# Patient Record
Sex: Male | Born: 2000 | Race: White | Hispanic: No | Marital: Single | Smoking: Never smoker
Health system: Southern US, Community
[De-identification: ages and names within clinical notes are randomized; demographics above are authoritative.]

---
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
## 2020-12-16 ENCOUNTER — Ambulatory Visit: Payer: Self-pay | Admitting: Nurse Practitioner

## 2020-12-16 ENCOUNTER — Other Ambulatory Visit: Payer: Self-pay

## 2020-12-16 VITALS — BP 133/73 | HR 61 | Temp 98.4°F | Resp 17

## 2020-12-16 DIAGNOSIS — S0591XA Unspecified injury of right eye and orbit, initial encounter: Secondary | ICD-10-CM

## 2020-12-16 MED ORDER — POLYMYXIN B-TRIMETHOPRIM 10000-0.1 UNIT/ML-% OP SOLN: 1.0000 [drp] | OPHTHALMIC | 0 refills | Status: AC

## 2020-12-16 NOTE — Progress Notes (Signed)
[name] 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   Injury Description  Patient was mowing the grass this morning and felt something fly into his eye. Unsure if it was insect, grass or something from lawn. He immediately went to wash his eye out with water and then reported to [name] for evaluation.    Provider Note Right eye sclera red and eye is tearing. Difficulty to keep eye open. Eye irrigated three times with saline. Symptoms improved but not resolved. Right eye exam without foreign body identified. EOMI, PERRLA, eyes tearing but vision not impaired. Attempted eye pad/patch without relief, advised patient to start eye drops today, rest at home. Sunglasses outdoors and return to clinic for evaluation tomorrow. If symptoms persist will consider referral to Opthalmology.     Diagnosis  1. Right eye injury, initial encounter  - trimethoprim-polymyxin b (POLYTRIM) ophthalmic solution; Place 1 drop into the right eye every 4 (four) hours for 5 days.  Dispense: 10 mL; Refill: 0    Continue eye drops tonight, rest eye RTC tomorrow.   Meds ordered this encounter  Medications   trimethoprim-polymyxin b (POLYTRIM) ophthalmic solution    Sig: Place 1 drop into the right eye every 4 (four) hours for 5 days.    Dispense:   10 mL    Refill:  0      Referred to consider opthalmology tomorrow if no improvement     Return to Work Status Will return to clinic 12/17/20 for evaluation and determination of work return status. Should remain out of work until that time.    Provider Signature ________________________________________Date_________   Employee Signature _______________________________________Date_________   Please email this completed form to Angus Seller, Director of Risk Management at [email or web address] within 24 hours of visit.

## 2020-12-17 ENCOUNTER — Encounter: Payer: Self-pay | Admitting: Nurse Practitioner

## 2020-12-17 ENCOUNTER — Ambulatory Visit: Payer: Self-pay | Admitting: Nurse Practitioner

## 2020-12-17 VITALS — BP 135/64 | HR 76 | Temp 98.3°F | Resp 16 | Ht 72.0 in | Wt 181.2 lb

## 2020-12-17 DIAGNOSIS — S0591XD Unspecified injury of right eye and orbit, subsequent encounter: Secondary | ICD-10-CM

## 2020-12-17 NOTE — Progress Notes (Signed)
[name] [address]  [place], [place] [address] Phone [phone] Fax  [phone]  Worker's Compensation Report Form   Justin Faulkner Date of HGDJM:[id number] Phone Number: [phone] Email:[email or web address] Department: Ronnell [place] Job Title: Landscaper Supervisor: Christophe Louis Supervisor Notified: Yes  Date of Injury: 12/16/20 Time of Injury: 0800 Shift Worked: 1st Location where injury occurred (address or landmark): [employer] Part Injured: Right eye     Injury Description  Patient was mowing the grass this morning and felt something fly into his eye. Unsure if it was insect, grass or something from lawn. He immediately went to wash his eye out with water and then reported to [name] for evaluation.      Provider Note Eye is improving today. Patient states that it feels dry. Sclera clear. Lids swept no lesions or foreign bodies identified. Conjunctiva WNL. No drainage or erythema no swelling. EOMI, PERRLA.    Diagnosis  Right eye injury   Medications Prescribed  Finish eye drops over the weekend    Referred to N/A    Return to Work Status May return to work now, advised wearing eye protection and sunglasses when outdoors as well. RTC as needed    Provider Signature ________________________________________Date_________   Employee Signature _______________________________________Date_________   Please email this completed form to Angus Seller, Director of Risk Management at [email or web address] within 24 hours of visit.

## 2021-10-04 ENCOUNTER — Ambulatory Visit: Payer: Self-pay | Admitting: Medical

## 2021-10-04 ENCOUNTER — Encounter: Payer: Self-pay | Admitting: Medical

## 2021-10-04 VITALS — BP 102/78 | HR 68 | Temp 97.1°F | Resp 16

## 2021-10-04 DIAGNOSIS — H1031 Unspecified acute conjunctivitis, right eye: Secondary | ICD-10-CM

## 2021-10-04 DIAGNOSIS — T63441A Toxic effect of venom of bees, accidental (unintentional), initial encounter: Secondary | ICD-10-CM

## 2021-10-04 MED ORDER — SULFACETAMIDE SODIUM 10 % OP SOLN: 1.0000 [drp] | Freq: Four times a day (QID) | OPHTHALMIC | 0 refills | Status: AC

## 2021-10-04 MED ORDER — PREDNISONE 10 MG (21) PO TBPK: ORAL_TABLET | ORAL | 0 refills | Status: DC

## 2021-10-04 NOTE — Progress Notes (Signed)
Patient ID: Justin Faulkner, male   DOB: Jan 02, 2001, 21 y.o.   MRN: [id number]  [facility]  [facility] [address]  [place], [place] [address] Phone [phone] Fax  [phone]  Worker's Compensation Report Form   NISHANTH MCCAUGHAN Date of BMWUX:[id number] Phone [phone] Email:[email or web address] Department:Landscaping Job Title:Groundkeeper Supervisor:Michael LaPlacka  Supervisor Notified:Yes  Date of Injury:10/03/21 Time of Injury:1100 AM Shift Worked:First Location where injury occurred (address or landmark[place]  Body Part Injured: R eye  Vital Signs  10/04/2021 8:22 AM  BP 102/78  BP Location Left Arm  Patient Position Sitting  Cuff Size Normal  Pulse 68  Resp 16  Temp 97.1 F (36.2 C)  Temp src Tympanic  SpO2 98 %    Injury Description  Patient reports he was picking up brush/sticks outside of the [employer] [place] and was stung by a bee   Provider Note Patient stung by bee or wasp yesterday at  right temple. Today with swelling to right eye and upper cheek. No erythema. EOMI , PERRLA. Cornea appears clear. Noted crusting in lashes. Visual acuity    Diagnosis  Bee sting to right temple area.  Medications Prescribed  Meds ordered this encounter  Medications   predniSONE (STERAPRED UNI-PAK 21 TAB) 10 MG (21) TBPK tablet    Sig: Take 6 tablets by mouth today , then 5 tablets tomorrow then one less tablet every day thereafter. Take with food.    Dispense:  21 tablet    Refill:  0   sulfacetamide (BLEPH-10) 10 % ophthalmic solution    Sig: Place 1 drop into the right eye 4 (four) times daily for 7 days.    Dispense:  5 mL    Refill:  0   Ice to the area, Zyrtec during the day and benadryl at night.  Referred to return to clinic on Friday for a recheck.  Encounter Diagnoses  Name Primary?   Bee sting, accidental or unintentional, initial encounter Yes   Acute conjunctivitis of right eye,  unspecified acute conjunctivitis type      Return to Work Status No operating heavy equipment he took  50 mg of Benadryl before coming to work.   Provider Signature ________________________________________Date_________   Employee Signature _______________________________________Date_________   Please email this completed form to Angus Seller, Director of Risk Management at [email or web address] within 24 hours of visit.

## 2021-10-07 ENCOUNTER — Ambulatory Visit: Payer: Self-pay | Admitting: Adult Health

## 2021-10-07 ENCOUNTER — Encounter: Payer: Self-pay | Admitting: Adult Health

## 2021-10-07 VITALS — BP 108/70 | HR 84 | Temp 97.7°F

## 2021-10-07 DIAGNOSIS — T63441A Toxic effect of venom of bees, accidental (unintentional), initial encounter: Secondary | ICD-10-CM

## 2021-10-07 NOTE — Progress Notes (Signed)
Patient ID: DEKKER VERGA, male   DOB: 2001/01/20, 21 y.o.   MRN: [id number]   [facility]  [facility] and [facility] [address]  [place], [place] [address] Phone [phone] Fax  [phone]   Worker's Compensation Report Form    STEPHENSON CICHY Date of QHKUV:[id number] Phone [phone] Email:[email or web address] Department:Landscaping Job Title:Groundkeeper Supervisor:Michael LaPlacka  Supervisor Notified:Yes   Date of Injury:10/03/21 Time of Injury:1100 AM Shift Worked:First Location where injury occurred (address or landmark[place] of [place]   Body Part Injured: R eye   Vital Signs   10/04/2021 8:22 AM  BP 102/78  BP Location Left Arm  Patient Position Sitting  Cuff Size Normal  Pulse 68  Resp 16  Temp 97.1 F (36.2 C)  Temp src Tympanic  SpO2 98 %      Injury Description  Patient reports he was picking up brush/sticks outside of the [employer] [place] and was stung by a bee     Provider Note Patient stung by bee or wasp yesterday at  right temple. Today with swelling to right eye and upper cheek. No erythema. EOMI , PERRLA. Cornea appears clear. Noted crusting in lashes. Visual acuity       Diagnosis  Bee sting to right temple area.   Medications Prescribed      Meds ordered this encounter  Medications   predniSONE (STERAPRED UNI-PAK 21 TAB) 10 MG (21) TBPK tablet      Sig: Take 6 tablets by mouth today , then 5 tablets tomorrow then one less tablet every day thereafter. Take with food.      Dispense:  21 tablet      Refill:  0   sulfacetamide (BLEPH-10) 10 % ophthalmic solution      Sig: Place 1 drop into the right eye 4 (four) times daily for 7 days.      Dispense:  5 mL      Refill:  0   Ice to the area, Zyrtec during the day and benadryl at night.   Return to clinic as needed or for any future concerns.        Return to Work Status May return to work without restriction.  Johnna Acosta DNP,  NP-C Nurse Practitioner

## 2022-04-07 ENCOUNTER — Ambulatory Visit (INDEPENDENT_AMBULATORY_CARE_PROVIDER_SITE_OTHER): Payer: Self-pay | Admitting: Adult Health

## 2022-04-07 ENCOUNTER — Encounter: Payer: Self-pay | Admitting: Adult Health

## 2022-04-07 VITALS — BP 118/68 | HR 116 | Temp 99.4°F | Ht 72.0 in | Wt 173.6 lb

## 2022-04-07 DIAGNOSIS — J101 Influenza due to other identified influenza virus with other respiratory manifestations: Secondary | ICD-10-CM

## 2022-04-07 DIAGNOSIS — R509 Fever, unspecified: Secondary | ICD-10-CM

## 2022-04-07 LAB — POC SOFIA 2 FLU + SARS ANTIGEN FIA
Influenza A, POC: POSITIVE — AB
Influenza B, POC: NEGATIVE
SARS Coronavirus 2 Ag: NEGATIVE

## 2022-04-07 MED ORDER — OSELTAMIVIR PHOSPHATE 75 MG PO CAPS: 75.0000 mg | ORAL_CAPSULE | Freq: Two times a day (BID) | ORAL | 0 refills | Status: DC

## 2022-04-07 NOTE — Progress Notes (Signed)
[facility] [address] [place], [place] [address]   Office Visit Note  Patient Name: Justin Faulkner Date of Birth [birth year]  Medical Record number [id number]  Date of Service: 04/07/2022  Chief Complaint  Patient presents with   Sinusitis    Started Demetrius Charity. Morning. Sinus congestion, coughing, headache, light headed and fever. Has not taking a COVID test. NO ST, BA   Cough     HPI Pt is here for a sick visit. He started feeling bad last night.  He reports some light headedness, coughing, congestion in his head and nose.  He also reports feeling feverish.  He has has taken dayquil and ibuprophen.  Last dose this morning.   Dad was sick a few days ago with similar symptoms however he has improved.   Current Medication:  Outpatient Encounter Medications as of 04/07/2022  Medication Sig   oseltamivir (TAMIFLU) 75 MG capsule Take 1 capsule (75 mg total) by mouth 2 (two) times daily.   predniSONE (STERAPRED UNI-PAK 21 TAB) 10 MG (21) TBPK tablet Take 6 tablets by mouth today , then 5 tablets tomorrow then one less tablet every day thereafter. Take with food. (Patient not taking: Reported on 04/07/2022)   No facility-administered encounter medications on file as of 04/07/2022.      Medical History: No past medical history on file.   Vital Signs: BP 118/68 (BP Location: Left Arm, Patient Position: Sitting, Cuff Size: Normal)   Pulse (!) 116   Temp 99.4 F (37.4 C) (Tympanic)   Ht 6' (1.829 m)   Wt 173 lb 9.6 oz (78.7 kg)   SpO2 98%   BMI 23.54 kg/m    Review of Systems  Constitutional:  Positive for chills, fatigue and fever. Negative for diaphoresis.  HENT:  Positive for congestion and sinus pressure.   Eyes:  Negative for pain and itching.  Respiratory:  Positive for cough.   Cardiovascular:  Negative for chest pain.  Gastrointestinal:  Negative for diarrhea, nausea and vomiting.    Physical Exam Vitals and nursing note reviewed.  Constitutional:       Appearance: Normal appearance.  HENT:     Head: Normocephalic.     Right Ear: Tympanic membrane and ear canal normal.     Left Ear: Tympanic membrane and ear canal normal.     Nose: Nose normal.     Mouth/Throat:     Mouth: Mucous membranes are moist.  Eyes:     Pupils: Pupils are equal, round, and reactive to light.  Cardiovascular:     Rate and Rhythm: Normal rate.     Pulses: Normal pulses.  Pulmonary:     Effort: Pulmonary effort is normal.     Breath sounds: Normal breath sounds.  Lymphadenopathy:     Cervical: No cervical adenopathy.  Neurological:     Mental Status: He is alert.      Results for orders placed or performed in visit on 04/07/22 (from the past 24 hour(s))  POC SOFIA 2 FLU + SARS ANTIGEN FIA     Status: Abnormal   Collection Time: 04/07/22  9:05 AM  Result Value Ref Range   Influenza A, POC Positive (A) Negative   Influenza B, POC Negative Negative   SARS Coronavirus 2 Ag Negative Negative    Assessment/Plan: 1. Influenza A Rest, and drink plenty of water.  Use cough drops, gargle warm sal water or drink wam liquids (like tea with honey) as needed for cough/throat irritation.  Take over-the-counter medicines (such as Dayquil or Nyquil) as discussed at your visit to help manage your symptoms.  Take Tamiflu as prescribed.   Send a MyChart message to the provider or schedule a return appointment as needed for new/worsening symptoms (especially shortness of breath or chest pain) or if symptoms not improving with recommended treatment over the next 5-7 days.     - oseltamivir (TAMIFLU) 75 MG capsule; Take 1 capsule (75 mg total) by mouth 2 (two) times daily.  Dispense: 10 capsule; Refill: 0  2. Fever, unspecified fever cause - POC SOFIA 2 FLU + SARS ANTIGEN FIA     General Counseling: Ruger verbalizes understanding of the findings of todays visit and agrees with plan of treatment. I have discussed any further diagnostic evaluation that may be needed  or ordered today. We also reviewed his medications today. he has been encouraged to call the office with any questions or concerns that should arise related to todays visit.   Orders Placed This Encounter  Procedures   POC SOFIA 2 FLU + SARS ANTIGEN FIA    Meds ordered this encounter  Medications   oseltamivir (TAMIFLU) 75 MG capsule    Sig: Take 1 capsule (75 mg total) by mouth 2 (two) times daily.    Dispense:  10 capsule    Refill:  0    Time spent:15 Minutes    Kendell Bane AGNP-C Nurse Practitioner

## 2023-01-22 ENCOUNTER — Encounter: Payer: Self-pay | Admitting: Physician Assistant

## 2023-01-22 ENCOUNTER — Ambulatory Visit (INDEPENDENT_AMBULATORY_CARE_PROVIDER_SITE_OTHER): Payer: Self-pay | Admitting: Physician Assistant

## 2023-01-22 ENCOUNTER — Other Ambulatory Visit: Payer: Self-pay

## 2023-01-22 VITALS — BP 120/80 | HR 84 | Temp 94.5°F | Ht 72.0 in | Wt 160.0 lb

## 2023-01-22 DIAGNOSIS — J069 Acute upper respiratory infection, unspecified: Secondary | ICD-10-CM

## 2023-01-22 NOTE — Progress Notes (Signed)
[facility] [address] [place], [place] [address]   Office Visit Note  Patient Name: Justin Faulkner Date of Birth [birth year]  Medical Record number [id number]  Date of Service: 01/22/2023  Chief Complaint  Patient presents with   Acute Visit    Patient reports having loss of taste and appetite, dry cough, and nasal congestion last week. He states his symptoms have improved but he is requesting a note for being out of work x 1 week. He was not tested for COVID/flu while symptomatic.     22 y/o M presents to the clinic for c/o multi-symptoms including cough, nasal congestion, sore throat, sinus pressure x 1 week. Did not follow up at the clinic for care. He took OTC Dayquil and Nyquil. Feels better now. Requesting a work excuse note for the time he was off last week.       Current Medication:  Outpatient Encounter Medications as of 01/22/2023  Medication Sig   [DISCONTINUED] oseltamivir (TAMIFLU) 75 MG capsule Take 1 capsule (75 mg total) by mouth 2 (two) times daily.   [DISCONTINUED] predniSONE (STERAPRED UNI-PAK 21 TAB) 10 MG (21) TBPK tablet Take 6 tablets by mouth today , then 5 tablets tomorrow then one less tablet every day thereafter. Take with food. (Patient not taking: Reported on 04/07/2022)   No facility-administered encounter medications on file as of 01/22/2023.      Medical History: History reviewed. No pertinent past medical history.   Vital Signs: BP 120/80   Pulse 84   Temp (!) 94.5 F (34.7 C) Comment: has been working outside for 2 hours  Ht 6' (1.829 m)   Wt 160 lb (72.6 kg)   SpO2 98%   BMI 21.70 kg/m    Review of Systems  Constitutional: Negative.   HENT: Negative.    Respiratory: Negative.    Cardiovascular: Negative.   Neurological: Negative.     Physical Exam Constitutional:      Appearance: Normal appearance.  HENT:     Head: Atraumatic.     Right Ear: Tympanic membrane and external ear normal. There is impacted cerumen.      Left Ear: Tympanic membrane and external ear normal. There is impacted cerumen.     Nose: Nose normal.     Mouth/Throat:     Mouth: Mucous membranes are moist.     Pharynx: Oropharynx is clear.  Eyes:     Extraocular Movements: Extraocular movements intact.  Cardiovascular:     Rate and Rhythm: Normal rate and regular rhythm.  Pulmonary:     Effort: Pulmonary effort is normal.     Breath sounds: Normal breath sounds.  Musculoskeletal:     Cervical back: Neck supple.  Skin:    General: Skin is warm.  Neurological:     Mental Status: He is alert.  Psychiatric:        Mood and Affect: Mood normal.        Behavior: Behavior normal.        Thought Content: Thought content normal.        Judgment: Judgment normal.       Assessment/Plan:  1. URI with cough and congestion  Continue to feel better A work excuse note stating he was seen today at the clinic and can RTW today. Note is in chart.   General Counseling: Tamera Punt understanding of the findings of todays visit and agrees with plan of treatment. I have discussed any further diagnostic evaluation that may be  needed or ordered today. We also reviewed his medications today. he has been encouraged to call the office with any questions or concerns that should arise related to todays visit.    Time spent:20 Minutes    Gilberto Better, [place] Physician Assistant
